# Patient Record
Sex: Male | Born: 1975 | Race: White | Hispanic: No | Marital: Married | Smoking: Never smoker
Health system: Southern US, Community
[De-identification: ages and names within clinical notes are randomized; demographics above are authoritative.]

## PROBLEM LIST (undated history)

## (undated) DIAGNOSIS — Z87442 Personal history of urinary calculi: Secondary | ICD-10-CM

## (undated) DIAGNOSIS — L4050 Arthropathic psoriasis, unspecified: Secondary | ICD-10-CM

## (undated) DIAGNOSIS — T505X5A Adverse effect of appetite depressants, initial encounter: Secondary | ICD-10-CM

## (undated) DIAGNOSIS — G4730 Sleep apnea, unspecified: Secondary | ICD-10-CM

## (undated) DIAGNOSIS — M459 Ankylosing spondylitis of unspecified sites in spine: Secondary | ICD-10-CM

## (undated) DIAGNOSIS — K760 Fatty (change of) liver, not elsewhere classified: Secondary | ICD-10-CM

## (undated) DIAGNOSIS — I10 Essential (primary) hypertension: Secondary | ICD-10-CM

## (undated) HISTORY — PX: VASECTOMY: SHX75

## (undated) HISTORY — PX: COLONOSCOPY W/ POLYPECTOMY: SHX1380

---
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
## 1993-06-04 HISTORY — PX: KNEE ARTHROSCOPY: SHX127

## 1997-11-11 ENCOUNTER — Emergency Department (HOSPITAL_COMMUNITY): Admission: EM | Admit: 1997-11-11 | Discharge: 1997-11-11 | Payer: Self-pay | Admitting: Emergency Medicine

## 2000-10-21 ENCOUNTER — Ambulatory Visit (HOSPITAL_COMMUNITY): Admission: RE | Admit: 2000-10-21 | Discharge: 2000-10-21 | Payer: Self-pay | Admitting: Family Medicine

## 2000-10-21 ENCOUNTER — Encounter: Payer: Self-pay | Admitting: Family Medicine

## 2002-03-12 ENCOUNTER — Emergency Department (HOSPITAL_COMMUNITY): Admission: EM | Admit: 2002-03-12 | Discharge: 2002-03-13 | Payer: Self-pay | Admitting: *Deleted

## 2002-03-13 ENCOUNTER — Encounter: Payer: Self-pay | Admitting: Emergency Medicine

## 2002-03-14 ENCOUNTER — Encounter: Payer: Self-pay | Admitting: *Deleted

## 2002-03-14 ENCOUNTER — Ambulatory Visit (HOSPITAL_COMMUNITY): Admission: RE | Admit: 2002-03-14 | Discharge: 2002-03-14 | Payer: Self-pay | Admitting: *Deleted

## 2006-10-26 ENCOUNTER — Encounter: Admission: RE | Admit: 2006-10-26 | Discharge: 2006-10-26 | Payer: Self-pay | Admitting: Rheumatology

## 2009-07-29 ENCOUNTER — Encounter: Admission: RE | Admit: 2009-07-29 | Discharge: 2009-07-29 | Payer: Self-pay | Admitting: Family Medicine

## 2009-10-24 ENCOUNTER — Ambulatory Visit (HOSPITAL_COMMUNITY): Admission: RE | Admit: 2009-10-24 | Discharge: 2009-10-24 | Payer: Self-pay | Admitting: Urology

## 2009-10-28 ENCOUNTER — Ambulatory Visit (HOSPITAL_BASED_OUTPATIENT_CLINIC_OR_DEPARTMENT_OTHER): Admission: RE | Admit: 2009-10-28 | Discharge: 2009-10-28 | Payer: Self-pay | Admitting: Urology

## 2010-06-04 HISTORY — PX: OTHER SURGICAL HISTORY: SHX169

## 2010-06-25 ENCOUNTER — Encounter: Payer: Self-pay | Admitting: Family Medicine

## 2012-12-19 ENCOUNTER — Other Ambulatory Visit: Payer: Self-pay | Admitting: Family Medicine

## 2012-12-19 DIAGNOSIS — N5089 Other specified disorders of the male genital organs: Secondary | ICD-10-CM

## 2012-12-26 ENCOUNTER — Ambulatory Visit
Admission: RE | Admit: 2012-12-26 | Discharge: 2012-12-26 | Disposition: A | Discharge status: Home / Self Care | Payer: 59 | Source: Ambulatory Visit | Attending: Family Medicine | Admitting: Family Medicine

## 2012-12-26 DIAGNOSIS — N5089 Other specified disorders of the male genital organs: Secondary | ICD-10-CM

## 2013-07-20 ENCOUNTER — Other Ambulatory Visit: Payer: Self-pay | Admitting: Rheumatology

## 2013-07-20 ENCOUNTER — Ambulatory Visit
Admission: RE | Admit: 2013-07-20 | Discharge: 2013-07-20 | Disposition: A | Discharge status: Home / Self Care | Payer: 59 | Source: Ambulatory Visit | Attending: Rheumatology | Admitting: Rheumatology

## 2013-07-20 DIAGNOSIS — M542 Cervicalgia: Secondary | ICD-10-CM

## 2013-07-20 DIAGNOSIS — M549 Dorsalgia, unspecified: Secondary | ICD-10-CM

## 2016-07-26 DIAGNOSIS — M459 Ankylosing spondylitis of unspecified sites in spine: Secondary | ICD-10-CM | POA: Diagnosis not present

## 2016-07-26 DIAGNOSIS — M79641 Pain in right hand: Secondary | ICD-10-CM | POA: Diagnosis not present

## 2016-07-26 DIAGNOSIS — M545 Low back pain: Secondary | ICD-10-CM | POA: Diagnosis not present

## 2016-07-26 DIAGNOSIS — M79642 Pain in left hand: Secondary | ICD-10-CM | POA: Diagnosis not present

## 2016-08-01 DIAGNOSIS — Z79899 Other long term (current) drug therapy: Secondary | ICD-10-CM | POA: Diagnosis not present

## 2016-08-01 DIAGNOSIS — M542 Cervicalgia: Secondary | ICD-10-CM | POA: Diagnosis not present

## 2016-12-03 DIAGNOSIS — L720 Epidermal cyst: Secondary | ICD-10-CM | POA: Diagnosis not present

## 2016-12-03 DIAGNOSIS — L400 Psoriasis vulgaris: Secondary | ICD-10-CM | POA: Diagnosis not present

## 2016-12-03 DIAGNOSIS — D485 Neoplasm of uncertain behavior of skin: Secondary | ICD-10-CM | POA: Diagnosis not present

## 2016-12-03 DIAGNOSIS — B078 Other viral warts: Secondary | ICD-10-CM | POA: Diagnosis not present

## 2016-12-03 DIAGNOSIS — D225 Melanocytic nevi of trunk: Secondary | ICD-10-CM | POA: Diagnosis not present

## 2016-12-17 DIAGNOSIS — D225 Melanocytic nevi of trunk: Secondary | ICD-10-CM | POA: Diagnosis not present

## 2016-12-17 DIAGNOSIS — D485 Neoplasm of uncertain behavior of skin: Secondary | ICD-10-CM | POA: Diagnosis not present

## 2016-12-17 DIAGNOSIS — L988 Other specified disorders of the skin and subcutaneous tissue: Secondary | ICD-10-CM | POA: Diagnosis not present

## 2017-01-10 DIAGNOSIS — D485 Neoplasm of uncertain behavior of skin: Secondary | ICD-10-CM | POA: Diagnosis not present

## 2017-01-10 DIAGNOSIS — L988 Other specified disorders of the skin and subcutaneous tissue: Secondary | ICD-10-CM | POA: Diagnosis not present

## 2017-01-10 DIAGNOSIS — D225 Melanocytic nevi of trunk: Secondary | ICD-10-CM | POA: Diagnosis not present

## 2017-03-05 DIAGNOSIS — I10 Essential (primary) hypertension: Secondary | ICD-10-CM | POA: Diagnosis not present

## 2017-03-05 DIAGNOSIS — Z0000 Encounter for general adult medical examination without abnormal findings: Secondary | ICD-10-CM | POA: Diagnosis not present

## 2017-03-05 DIAGNOSIS — L4059 Other psoriatic arthropathy: Secondary | ICD-10-CM | POA: Diagnosis not present

## 2017-03-05 DIAGNOSIS — L409 Psoriasis, unspecified: Secondary | ICD-10-CM | POA: Diagnosis not present

## 2017-03-21 DIAGNOSIS — Z23 Encounter for immunization: Secondary | ICD-10-CM | POA: Diagnosis not present

## 2017-04-08 DIAGNOSIS — H5213 Myopia, bilateral: Secondary | ICD-10-CM | POA: Diagnosis not present

## 2017-04-10 DIAGNOSIS — R52 Pain, unspecified: Secondary | ICD-10-CM | POA: Diagnosis not present

## 2017-04-10 DIAGNOSIS — M7918 Myalgia, other site: Secondary | ICD-10-CM | POA: Diagnosis not present

## 2017-04-10 DIAGNOSIS — L4059 Other psoriatic arthropathy: Secondary | ICD-10-CM | POA: Diagnosis not present

## 2017-04-19 DIAGNOSIS — Z713 Dietary counseling and surveillance: Secondary | ICD-10-CM | POA: Diagnosis not present

## 2017-04-23 DIAGNOSIS — L4050 Arthropathic psoriasis, unspecified: Secondary | ICD-10-CM | POA: Diagnosis not present

## 2017-04-23 DIAGNOSIS — L409 Psoriasis, unspecified: Secondary | ICD-10-CM | POA: Diagnosis not present

## 2017-05-21 ENCOUNTER — Encounter (HOSPITAL_COMMUNITY): Payer: Self-pay

## 2017-05-21 ENCOUNTER — Emergency Department (HOSPITAL_COMMUNITY): Payer: Commercial Managed Care - PPO

## 2017-05-21 ENCOUNTER — Emergency Department (HOSPITAL_COMMUNITY)
Admission: EM | Admit: 2017-05-21 | Discharge: 2017-05-21 | Disposition: A | Discharge status: Home / Self Care | Payer: Commercial Managed Care - PPO | Attending: Emergency Medicine | Admitting: Emergency Medicine

## 2017-05-21 ENCOUNTER — Other Ambulatory Visit: Payer: Self-pay

## 2017-05-21 DIAGNOSIS — R112 Nausea with vomiting, unspecified: Secondary | ICD-10-CM | POA: Insufficient documentation

## 2017-05-21 DIAGNOSIS — I10 Essential (primary) hypertension: Secondary | ICD-10-CM | POA: Insufficient documentation

## 2017-05-21 DIAGNOSIS — N201 Calculus of ureter: Secondary | ICD-10-CM | POA: Diagnosis not present

## 2017-05-21 DIAGNOSIS — N23 Unspecified renal colic: Secondary | ICD-10-CM

## 2017-05-21 DIAGNOSIS — N132 Hydronephrosis with renal and ureteral calculous obstruction: Secondary | ICD-10-CM | POA: Diagnosis not present

## 2017-05-21 DIAGNOSIS — Z79899 Other long term (current) drug therapy: Secondary | ICD-10-CM | POA: Insufficient documentation

## 2017-05-21 DIAGNOSIS — R0789 Other chest pain: Secondary | ICD-10-CM | POA: Diagnosis not present

## 2017-05-21 DIAGNOSIS — R1012 Left upper quadrant pain: Secondary | ICD-10-CM | POA: Diagnosis present

## 2017-05-21 DIAGNOSIS — R109 Unspecified abdominal pain: Secondary | ICD-10-CM | POA: Diagnosis not present

## 2017-05-21 HISTORY — DX: Arthropathic psoriasis, unspecified: L40.50

## 2017-05-21 HISTORY — DX: Essential (primary) hypertension: I10

## 2017-05-21 LAB — URINALYSIS, ROUTINE W REFLEX MICROSCOPIC
Bilirubin Urine: NEGATIVE
Glucose, UA: NEGATIVE mg/dL
Ketones, ur: NEGATIVE mg/dL
Leukocytes, UA: NEGATIVE
NITRITE: NEGATIVE
PROTEIN: 30 mg/dL — AB
SQUAMOUS EPITHELIAL / LPF: NONE SEEN
Specific Gravity, Urine: 1.021 (ref 1.005–1.030)
pH: 5 (ref 5.0–8.0)

## 2017-05-21 LAB — CBC
HEMATOCRIT: 47.9 % (ref 39.0–52.0)
Hemoglobin: 16.1 g/dL (ref 13.0–17.0)
MCH: 29.1 pg (ref 26.0–34.0)
MCHC: 33.6 g/dL (ref 30.0–36.0)
MCV: 86.5 fL (ref 78.0–100.0)
Platelets: 335 10*3/uL (ref 150–400)
RBC: 5.54 MIL/uL (ref 4.22–5.81)
RDW: 13.4 % (ref 11.5–15.5)
WBC: 14.4 10*3/uL — ABNORMAL HIGH (ref 4.0–10.5)

## 2017-05-21 LAB — COMPREHENSIVE METABOLIC PANEL
ALT: 29 U/L (ref 17–63)
AST: 23 U/L (ref 15–41)
Albumin: 4.3 g/dL (ref 3.5–5.0)
Alkaline Phosphatase: 107 U/L (ref 38–126)
Anion gap: 8 (ref 5–15)
BILIRUBIN TOTAL: 0.6 mg/dL (ref 0.3–1.2)
BUN: 21 mg/dL — ABNORMAL HIGH (ref 6–20)
CO2: 24 mmol/L (ref 22–32)
Calcium: 9.6 mg/dL (ref 8.9–10.3)
Chloride: 106 mmol/L (ref 101–111)
Creatinine, Ser: 1 mg/dL (ref 0.61–1.24)
GFR calc Af Amer: >60 mL/min (ref >60)
GFR calc non Af Amer: >60 mL/min (ref >60)
Glucose, Bld: 165 mg/dL — ABNORMAL HIGH (ref 65–99)
POTASSIUM: 4.3 mmol/L (ref 3.5–5.1)
Sodium: 138 mmol/L (ref 135–145)
TOTAL PROTEIN: 8.1 g/dL (ref 6.5–8.1)

## 2017-05-21 LAB — LIPASE, BLOOD: Lipase: 34 U/L (ref 11–51)

## 2017-05-21 MED ORDER — TAMSULOSIN HCL 0.4 MG PO CAPS: ORAL_CAPSULE | ORAL | 0 refills | Status: DC

## 2017-05-21 MED ORDER — ONDANSETRON HCL 4 MG/2ML IJ SOLN
4.0000 mg | Freq: Once | INTRAMUSCULAR | Status: AC
  Administered 2017-05-21: 4 mg via INTRAVENOUS
  Filled 2017-05-21: qty 2

## 2017-05-21 MED ORDER — OXYCODONE-ACETAMINOPHEN 5-325 MG PO TABS: 1.0000 | ORAL_TABLET | ORAL | 0 refills | Status: DC | PRN

## 2017-05-21 MED ORDER — KETOROLAC TROMETHAMINE 30 MG/ML IJ SOLN
30.0000 mg | Freq: Once | INTRAMUSCULAR | Status: AC
  Administered 2017-05-21: 30 mg via INTRAVENOUS
  Filled 2017-05-21: qty 1

## 2017-05-21 MED ORDER — HYDROMORPHONE HCL 1 MG/ML IJ SOLN
1.0000 mg | INTRAMUSCULAR | Status: AC | PRN
  Administered 2017-05-21 (×2): 1 mg via INTRAVENOUS
  Filled 2017-05-21 (×2): qty 1

## 2017-05-21 MED ORDER — SODIUM CHLORIDE 0.9 % IV SOLN
INTRAVENOUS | Status: DC
  Administered 2017-05-21: 09:00:00 via INTRAVENOUS

## 2017-05-21 MED ORDER — HYDROMORPHONE HCL 1 MG/ML IJ SOLN
1.0000 mg | Freq: Once | INTRAMUSCULAR | Status: AC
  Administered 2017-05-21: 1 mg via INTRAVENOUS
  Filled 2017-05-21: qty 1

## 2017-05-21 NOTE — ED Notes (Signed)
Urinal at bedside. Patient aware we need urine

## 2017-05-21 NOTE — Discharge Instructions (Signed)
Strain your urine, until you catch the stone.  Do not drive, or work when taking the narcotic pain reliever.  Return here, if needed, for problems.

## 2017-05-21 NOTE — ED Provider Notes (Signed)
[facility] DEPT Provider Note   CSN: [id number] Arrival date & time: 05/21/17  8588     History   Chief Complaint Chief Complaint  Patient presents with  . Abdominal Pain  . Back Pain    HPI Trevor Vargas is a 41 y.o. male.  He presents for evaluation of left flank and left upper abdominal pain onset yesterday, worsening.  The pain is associated with nausea and vomiting several times.  The pain feels like a "kidney stone."  He states that his last episode of kidney stone problems was about 6 years ago.  He denies fever, chills, cough, weakness or dizziness.  He came here by private vehicle.  There are no other known modifying factors.  HPI  Past Medical History:  Diagnosis Date  . Hypertension   . Psoriatic arthritis ([place])     There are no active problems to display for this patient.   Past Surgical History:  Procedure Laterality Date  . EYE SURGERY    . kidney stone retraction         Home Medications    Prior to Admission medications   Medication Sig Start Date End Date Taking? Authorizing Provider  Apremilast (OTEZLA) 30 MG TABS Take 30 mg by mouth 2 (two) times daily.   Yes [provider]  lisinopril (PRINIVIL,ZESTRIL) 20 MG tablet Take 20 mg by mouth daily. 05/15/17  Yes [provider]  phentermine 37.5 MG capsule Take 37.5 mg by mouth daily. 05/11/17  Yes [provider]  oxyCODONE-acetaminophen (PERCOCET) 5-325 MG tablet Take 1 tablet by mouth every 4 (four) hours as needed. 05/21/17   Daleen Bo, MD  tamsulosin [facility]) 0.4 MG CAPS capsule 1 q HS to aid stone passage 05/21/17   Daleen Bo, MD    Family History Family History  Problem Relation Age of Onset  . Hypertension Mother   . Hypertension Father     Social History Social History   Tobacco Use  . Smoking status: Never Smoker  . Smokeless tobacco: Never Used  Substance Use Topics  . Alcohol use: No    Frequency: Never  .  Drug use: No     Allergies   Fish allergy   Review of Systems Review of Systems  All other systems reviewed and are negative.    Physical Exam Updated Vital Signs BP 126/84   Pulse 63   Temp (!) 96.7 F (35.9 C) (Axillary) Comment: uanble to collect oral PT drinking water  Resp 18   Ht 5' 9"  (1.753 m)   Wt 127 kg (280 lb)   SpO2 96%   BMI 41.35 kg/m   Physical Exam  Constitutional: He is oriented to person, place, and time. He appears well-developed and well-nourished.  Non-toxic appearance.  HENT:  Head: Normocephalic and atraumatic.  Right Ear: External ear normal.  Left Ear: External ear normal.  Eyes: Conjunctivae and EOM are normal. Pupils are equal, round, and reactive to light.  Neck: Normal range of motion and phonation normal. Neck supple.  Cardiovascular: Normal rate, regular rhythm and normal heart sounds.  Pulmonary/Chest: Effort normal and breath sounds normal. He exhibits no bony tenderness.  Abdominal: Soft. There is tenderness (Mild) in the left upper quadrant. There is no rigidity and no guarding.  Genitourinary:  Genitourinary Comments: Left flank is tender to light palpation.  Musculoskeletal: Normal range of motion.  Neurological: He is alert and oriented to person, place, and time. No cranial nerve deficit or sensory  deficit. He exhibits normal muscle tone. Coordination normal.  Skin: Skin is warm, dry and intact.  Psychiatric: He has a normal mood and affect. His behavior is normal. Judgment and thought content normal.  Nursing note and vitals reviewed.    ED Treatments / Results  Labs (all labs ordered are listed, but only abnormal results are displayed) Labs Reviewed  COMPREHENSIVE METABOLIC PANEL - Abnormal; Notable for the following components:      Result Value   Glucose, Bld 165 (*)    BUN 21 (*)    All other components within normal limits  CBC - Abnormal; Notable for the following components:   WBC 14.4 (*)    All other  components within normal limits  URINALYSIS, ROUTINE W REFLEX MICROSCOPIC - Abnormal; Notable for the following components:   APPearance CLOUDY (*)    Hgb urine dipstick LARGE (*)    Protein, ur 30 (*)    Bacteria, UA FEW (*)    All other components within normal limits  URINE CULTURE  LIPASE, BLOOD    EKG  EKG Interpretation None       Radiology Ct Renal Stone Study  Result Date: 05/21/2017 CLINICAL DATA:  Left flank pain beginning at 3:30 this morning. EXAM: CT ABDOMEN AND PELVIS WITHOUT CONTRAST TECHNIQUE: Multidetector CT imaging of the abdomen and pelvis was performed following the standard protocol without IV contrast. COMPARISON:  CT abdomen and pelvis 10/25/2009. FINDINGS: Lower chest: Densely calcified right lower lobe granuloma is noted. There is some dependent atelectasis in the lung bases. No pleural or pericardial effusion. Hepatobiliary: The liver is low attenuating consistent with diffuse fatty infiltration. The gallbladder and biliary tree appear normal. Pancreas: Fatty atrophy of the pancreas seen on the prior examination has progressed. No focal lesion or surrounding inflammatory change. Spleen: Normal in size without focal abnormality. Adrenals/Urinary Tract: The patient has mild left hydronephrosis due to a 0.6 cm stone at the left ureteropelvic junction. No other urinary tract stones are seen on the left. 0.2 cm nonobstructing stone lower pole right kidney is noted. The kidneys otherwise appear normal. Urinary bladder is unremarkable. The adrenal glands are normal. Stomach/Bowel: Stomach is within normal limits. Appendix appears normal. No evidence of bowel wall thickening, distention, or inflammatory changes. Vascular/Lymphatic: No significant vascular findings are present. No enlarged abdominal or pelvic lymph nodes. Reproductive: Prostate is unremarkable. Other: Fat containing umbilical hernia is seen. The patient also has a small fat containing bilateral inguinal  hernias. No ascites. Musculoskeletal: No acute or focal abnormality. IMPRESSION: Mild left hydronephrosis due to a 0.6 cm stone at the left ureteropelvic junction. 0.2 cm nonobstructing stone lower pole right kidney. Fatty infiltration of the liver. Fatty atrophy of the pancreas seen on the prior examination has progressed. Fat containing umbilical and bilateral inguinal hernias. Electronically Signed   By: Inge Rise M.D.   On: 05/21/2017 09:43    Procedures Procedures (including critical care time)  Medications Ordered in ED Medications  0.9 %  sodium chloride infusion ( Intravenous New Bag/Given 05/21/17 0837)  ondansetron (ZOFRAN) injection 4 mg (4 mg Intravenous Given 05/21/17 0837)  HYDROmorphone (DILAUDID) injection 1 mg (1 mg Intravenous Given 05/21/17 0927)  HYDROmorphone (DILAUDID) injection 1 mg (1 mg Intravenous Given 05/21/17 1101)  ketorolac (TORADOL) 30 MG/ML injection 30 mg (30 mg Intravenous Given 05/21/17 1247)     Initial Impression / Assessment and Plan / ED Course  I have reviewed the triage vital signs and the nursing notes.  Pertinent labs &  imaging results that were available during my care of the patient were reviewed by me and considered in my medical decision making (see chart for details).  Clinical Course as of May 22 1315  Tue May 21, 2017  1314 Elevated RBC / HPF: TOO NUMEROUS TO COUNT [EW]  1314 Elevated Bacteria, UA: (!) FEW [EW]  1314 Abnormal, clean catch sample Bacteria, UA: (!) FEW [EW]  1314 Mild elevation WBC: (!) 14.4 [EW]  1314 Elevated Glucose: (!) 165 [EW]    Clinical Course User Index [EW] Daleen Bo, MD     Patient Vitals for the past 24 hrs:  BP Temp Temp src Pulse Resp SpO2 Height Weight  05/21/17 1230 126/84 - - 63 - - - -  05/21/17 1200 118/84 - - 77 18 96 % - -  05/21/17 1130 (!) 124/95 - - 73 - 94 % - -  05/21/17 1100 127/86 - - (!) 58 - 94 % - -  05/21/17 1030 113/82 - - 66 - 97 % - -  05/21/17 1024 125/77 - - 77  18 97 % - -  05/21/17 0741 - - - - - - 5' 9"  (1.753 m) 127 kg (280 lb)  05/21/17 0738 (!) 140/104 (!) 96.7 F (35.9 C) Axillary 71 19 98 % - -    1:13 PM Reevaluation with update and discussion. After initial assessment and treatment, an updated evaluation reveals patient is fairly comfortable now and desires to go home.  Findings discussed and questions answered. Daleen Bo      Final Clinical Impressions(s) / ED Diagnoses   Final diagnoses:  Renal colic on left side  Left ureteral stone   Renal colic with ureteral stone, stone is fairly large but very distal and has a good chance of passing spontaneously.  Doubt UTI, or sepsis.  Urine culture sent.  Plan follow-up with urology 1 week, return here if needed  Nursing Notes Reviewed/ Care Coordinated Applicable Imaging Reviewed Interpretation of Laboratory Data incorporated into ED treatment  The patient appears reasonably screened and/or stabilized for discharge and I doubt any other medical condition or other [facility] requiring further screening, evaluation, or treatment in the ED at this time prior to discharge.  Plan: Home Medications-continue usual medications.; Home Treatments-strain urine; return here if the recommended treatment, does not improve the symptoms; Recommended follow up-PCP, as needed; urology, 1 week.    ED Discharge Orders        Ordered    oxyCODONE-acetaminophen (PERCOCET) 5-325 MG tablet  Every 4 hours PRN     05/21/17 1313    tamsulosin (FLOMAX) 0.4 MG CAPS capsule     05/21/17 1313       Daleen Bo, MD 05/21/17 1316

## 2017-05-21 NOTE — ED Notes (Signed)
Urinal at bedside, aware we need urine

## 2017-05-21 NOTE — ED Triage Notes (Signed)
Patient c/o left upper abdominal pain that radiates into the left mid back area that started at 0400 today. Patient also c/o vomiting x 4-5 today.

## 2017-05-22 LAB — URINE CULTURE: Culture: NO GROWTH

## 2017-05-23 DIAGNOSIS — N201 Calculus of ureter: Secondary | ICD-10-CM | POA: Diagnosis not present

## 2017-05-30 DIAGNOSIS — G4733 Obstructive sleep apnea (adult) (pediatric): Secondary | ICD-10-CM | POA: Diagnosis not present

## 2017-06-03 DIAGNOSIS — Z713 Dietary counseling and surveillance: Secondary | ICD-10-CM | POA: Diagnosis not present

## 2017-08-08 DIAGNOSIS — L4050 Arthropathic psoriasis, unspecified: Secondary | ICD-10-CM | POA: Diagnosis not present

## 2017-08-08 DIAGNOSIS — M459 Ankylosing spondylitis of unspecified sites in spine: Secondary | ICD-10-CM | POA: Diagnosis not present

## 2018-01-23 DIAGNOSIS — M459 Ankylosing spondylitis of unspecified sites in spine: Secondary | ICD-10-CM | POA: Diagnosis not present

## 2018-01-23 DIAGNOSIS — L4050 Arthropathic psoriasis, unspecified: Secondary | ICD-10-CM | POA: Diagnosis not present

## 2018-03-05 DIAGNOSIS — G4733 Obstructive sleep apnea (adult) (pediatric): Secondary | ICD-10-CM | POA: Diagnosis not present

## 2018-03-11 DIAGNOSIS — J069 Acute upper respiratory infection, unspecified: Secondary | ICD-10-CM | POA: Diagnosis not present

## 2018-03-11 DIAGNOSIS — Z6841 Body Mass Index (BMI) 40.0 and over, adult: Secondary | ICD-10-CM | POA: Diagnosis not present

## 2018-03-21 DIAGNOSIS — I10 Essential (primary) hypertension: Secondary | ICD-10-CM | POA: Diagnosis not present

## 2018-03-21 DIAGNOSIS — L4059 Other psoriatic arthropathy: Secondary | ICD-10-CM | POA: Diagnosis not present

## 2018-03-21 DIAGNOSIS — Z0000 Encounter for general adult medical examination without abnormal findings: Secondary | ICD-10-CM | POA: Diagnosis not present

## 2018-04-25 DIAGNOSIS — R7303 Prediabetes: Secondary | ICD-10-CM | POA: Diagnosis not present

## 2018-04-25 DIAGNOSIS — I10 Essential (primary) hypertension: Secondary | ICD-10-CM | POA: Diagnosis not present

## 2018-04-25 DIAGNOSIS — Z6841 Body Mass Index (BMI) 40.0 and over, adult: Secondary | ICD-10-CM | POA: Diagnosis not present

## 2018-04-25 DIAGNOSIS — E782 Mixed hyperlipidemia: Secondary | ICD-10-CM | POA: Diagnosis not present

## 2018-05-06 DIAGNOSIS — M459 Ankylosing spondylitis of unspecified sites in spine: Secondary | ICD-10-CM | POA: Diagnosis not present

## 2018-05-06 DIAGNOSIS — L409 Psoriasis, unspecified: Secondary | ICD-10-CM | POA: Diagnosis not present

## 2018-05-06 DIAGNOSIS — L4050 Arthropathic psoriasis, unspecified: Secondary | ICD-10-CM | POA: Diagnosis not present

## 2018-06-03 DIAGNOSIS — G4733 Obstructive sleep apnea (adult) (pediatric): Secondary | ICD-10-CM | POA: Diagnosis not present

## 2018-07-04 DIAGNOSIS — Z6841 Body Mass Index (BMI) 40.0 and over, adult: Secondary | ICD-10-CM | POA: Diagnosis not present

## 2018-07-04 DIAGNOSIS — Z713 Dietary counseling and surveillance: Secondary | ICD-10-CM | POA: Diagnosis not present

## 2018-07-30 DIAGNOSIS — G4733 Obstructive sleep apnea (adult) (pediatric): Secondary | ICD-10-CM | POA: Diagnosis not present

## 2019-01-16 IMAGING — CT CT RENAL STONE PROTOCOL
2 of 4 series · 16 of 46 positions shown, 18 images · non-contrast
Comparison: CT abdomen and pelvis 10/25/2009.

CLINICAL DATA: Left flank pain beginning at [DATE] this morning.

EXAM:
CT ABDOMEN AND PELVIS WITHOUT CONTRAST
TECHNIQUE: Multidetector CT imaging of the abdomen and pelvis was performed
following the standard protocol without IV contrast.

[Series 2: axial st · axial · 0.83mm/px · z∈[-463,-58]mm · 13 of 93 slices shown, 15 images]
[im 6/93  soft-tissue]
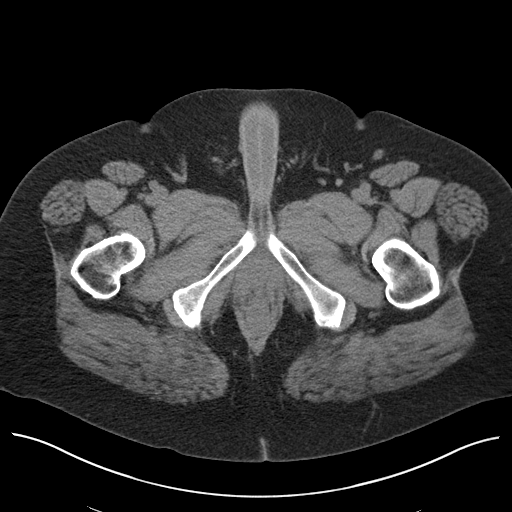
[im 6/93  bone]
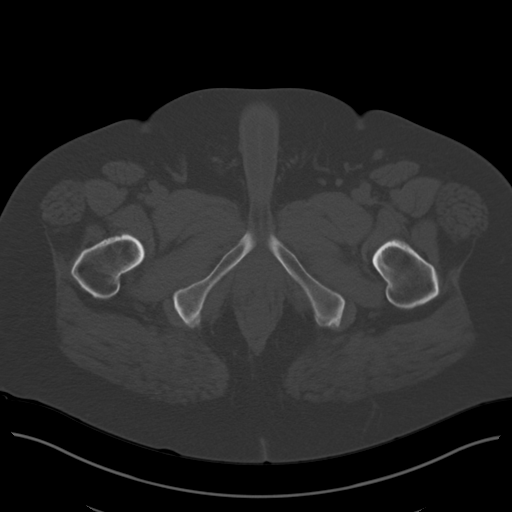
[im 11/93  soft-tissue]
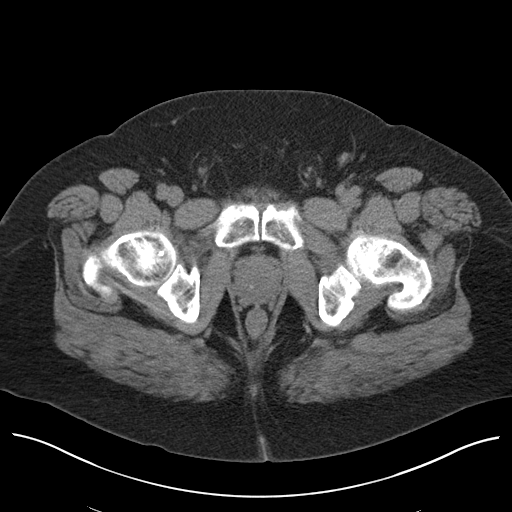
[im 21/93  soft-tissue]
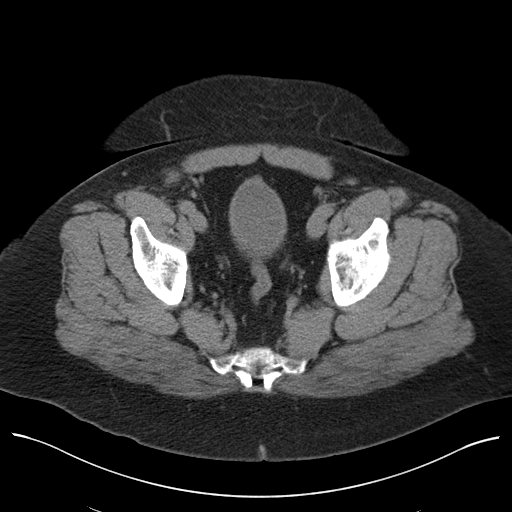
[im 26/93  soft-tissue]
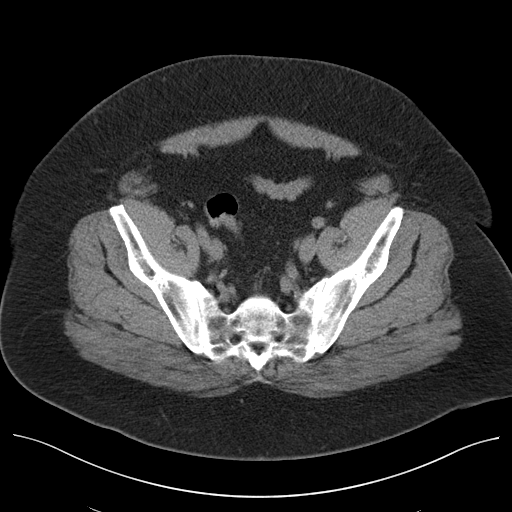
[im 31/93  soft-tissue]
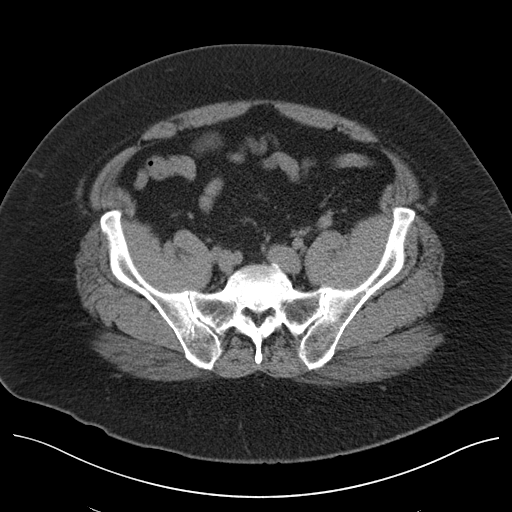
[im 41/93  soft-tissue]
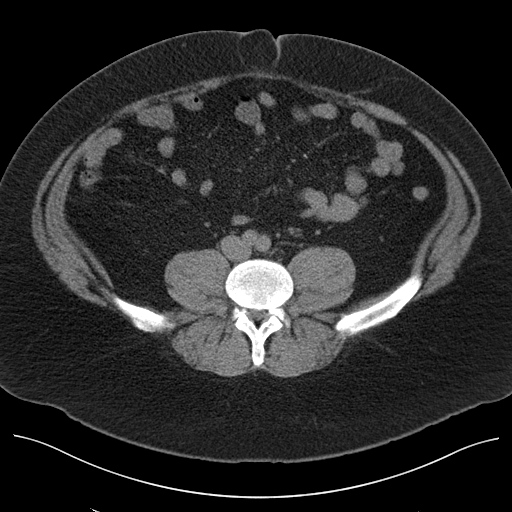
[im 47/93  soft-tissue]
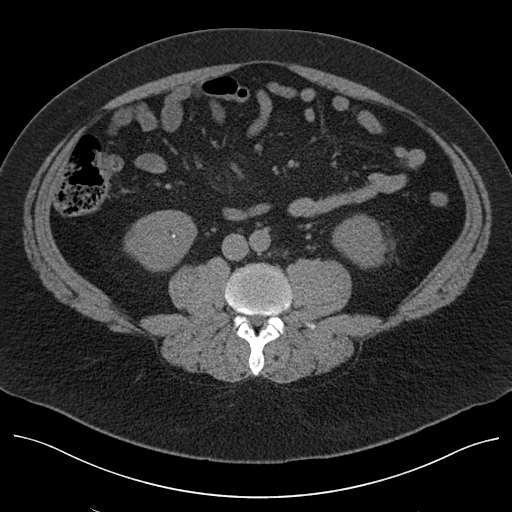
[im 52/93  soft-tissue]
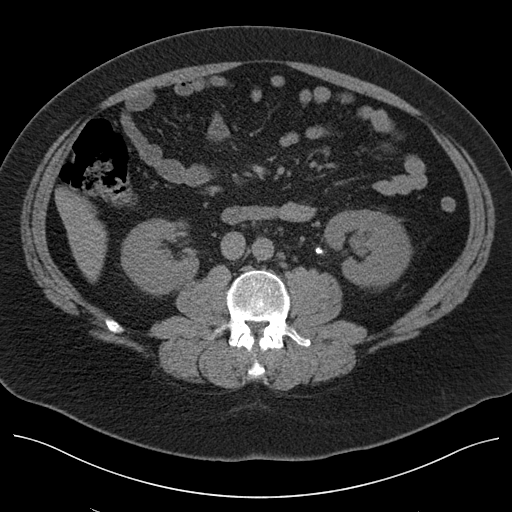
[im 62/93  soft-tissue]
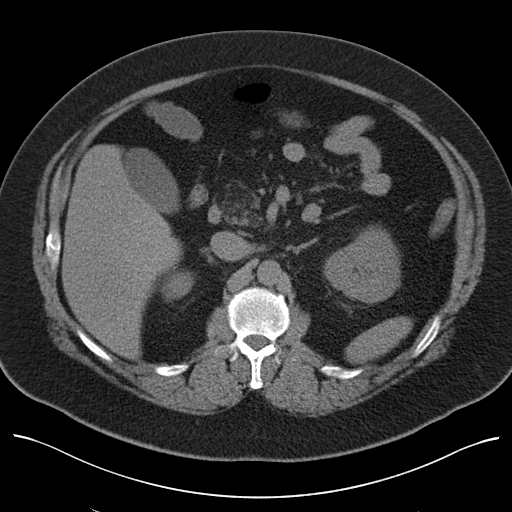
[im 62/93  bone]
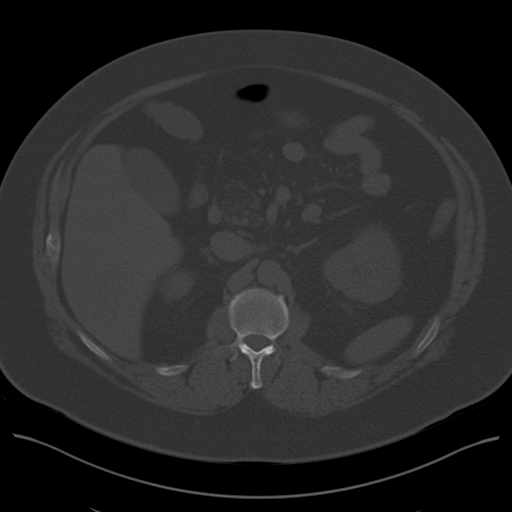
[im 67/93  soft-tissue]
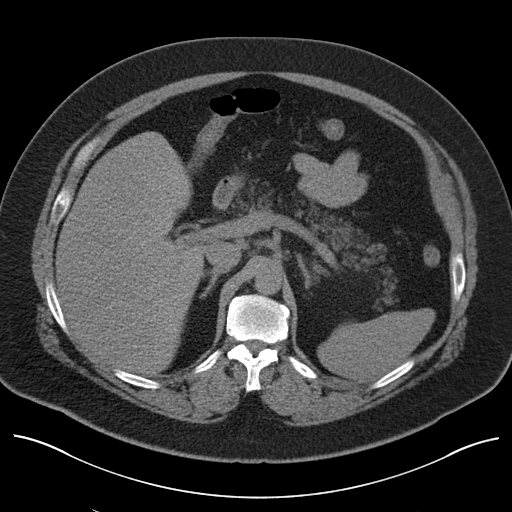
[im 72/93  soft-tissue]
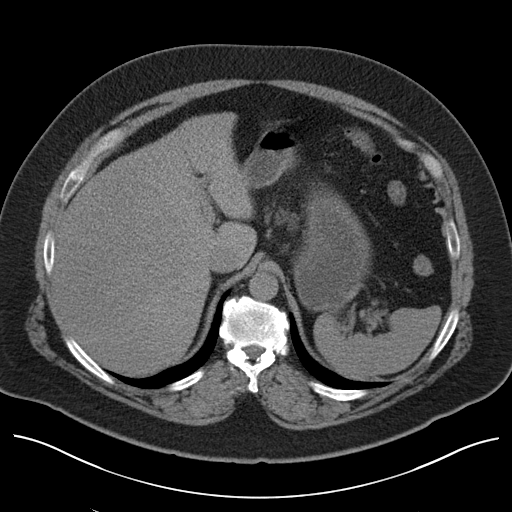
[im 82/93  soft-tissue]
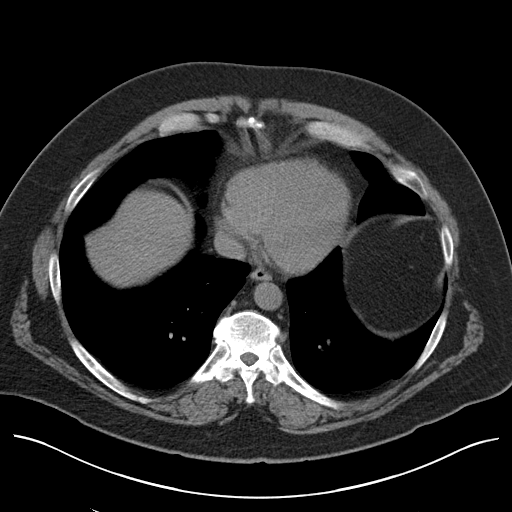
[im 87/93  soft-tissue]
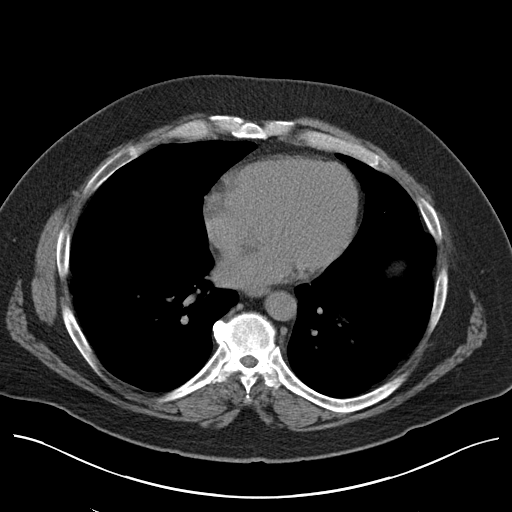

[Series 4: coronal · coronal · 0.92mm/px · 3 of 157 slices shown]
[im 53/157  soft-tissue]
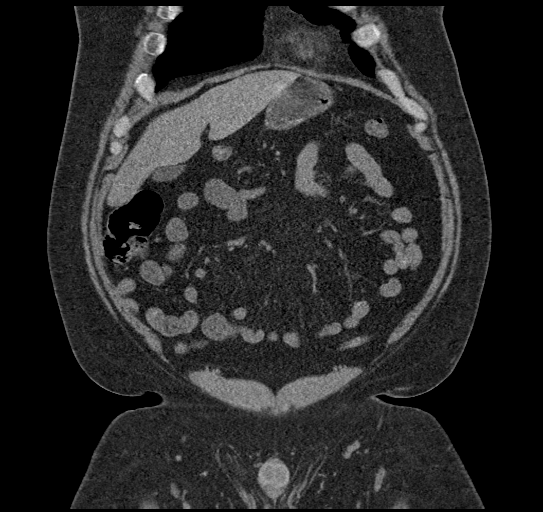
[im 70/157  soft-tissue]
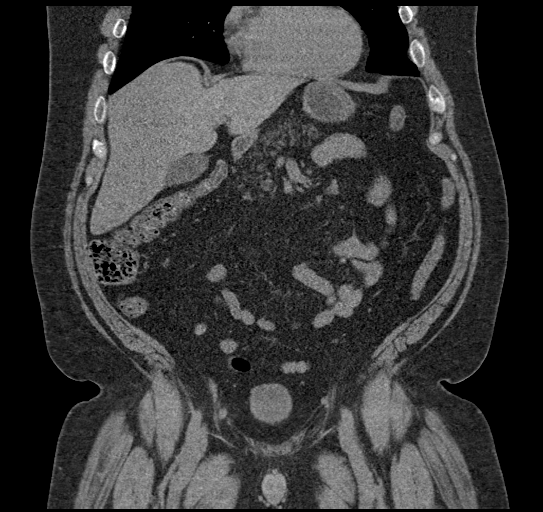
[im 87/157  soft-tissue]
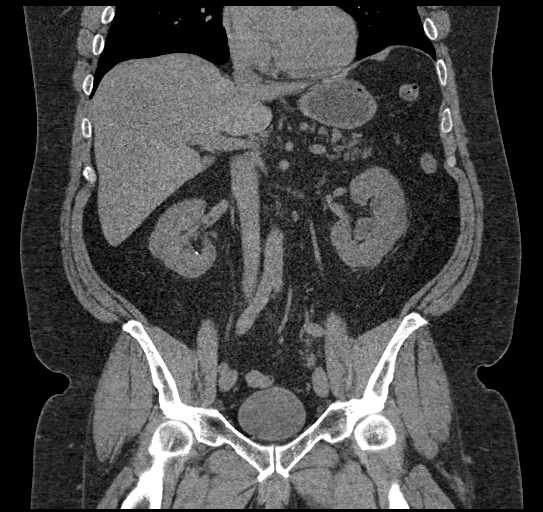

[16 of 46 positions shown; findings below may reference images not displayed]

FINDINGS: Lower chest: Densely calcified right lower lobe granuloma is noted.
There is some dependent atelectasis in the lung bases. No pleural or
pericardial effusion.

Hepatobiliary: The liver is low attenuating consistent with diffuse
fatty infiltration. The gallbladder and biliary tree appear normal.

Pancreas: Fatty atrophy of the pancreas seen on the prior
examination has progressed. No focal lesion or surrounding
inflammatory change.

Spleen: Normal in size without focal abnormality.

Adrenals/Urinary Tract: The patient has mild left hydronephrosis due
to a 0.6 cm stone at the left ureteropelvic junction. No other
urinary tract stones are seen on the left. 0.2 cm nonobstructing
stone lower pole right kidney is noted. The kidneys otherwise appear
normal. Urinary bladder is unremarkable. The adrenal glands are
normal.

Stomach/Bowel: Stomach is within normal limits. Appendix appears
normal. No evidence of bowel wall thickening, distention, or
inflammatory changes.

Vascular/Lymphatic: No significant vascular findings are present. No
enlarged abdominal or pelvic lymph nodes.

Reproductive: Prostate is unremarkable.

Other: Fat containing umbilical hernia is seen. The patient also has
a small fat containing bilateral inguinal hernias. No ascites.

Musculoskeletal: No acute or focal abnormality.
IMPRESSION: Mild left hydronephrosis due to a 0.6 cm stone at the left
ureteropelvic junction.

0.2 cm nonobstructing stone lower pole right kidney.

Fatty infiltration of the liver.

Fatty atrophy of the pancreas seen on the prior examination has
progressed.

Fat containing umbilical and bilateral inguinal hernias.

## 2023-10-15 ENCOUNTER — Ambulatory Visit: Payer: Self-pay | Admitting: Surgery

## 2023-10-15 NOTE — Progress Notes (Signed)
 Surgery orders requested via Epic inbox.

## 2023-10-15 NOTE — Progress Notes (Signed)
 COVID Vaccine received:  [x]  No []  Yes Date of any COVID positive Test in last 90 days:  none  PCP - Perley Bradley, MD at [facility]  [phone]   [phone]  Cardiologist - none Rheumatology- Stefan Edge, MD [phone]  Chest x-ray -  EKG -  will do at PST Stress Test -  ECHO -  Cardiac Cath -   Bowel Prep - [x]  No  []   Yes ______  Pacemaker / ICD device [x]  No []  Yes   Spinal Cord Stimulator:[x]  No []  Yes       History of Sleep Apnea? []  No [x]  Yes   CPAP used?- []  No [x]  Yes    Does the patient monitor blood sugar?   [x]  N/A   []  No []  Yes  Patient has: [x]  NO Hx DM   []  Pre-DM   []  DM1  []   DM2  Blood Thinner / Instructions:  none Aspirin Instructions:  none  ERAS Protocol Ordered: []  No  [x]  Yes PRE-SURGERY []  ENSURE  []  G2   [x]  No Drink Ordered Patient is to be NPO after: 0800  Dental hx: []  Dentures:  [x]  N/A      []  Bridge or Partial:                   []  Loose or Damaged teeth:   Activity level: Patient is able to climb a flight of stairs without difficulty; [x]  No CP  [x]  No SOB. Patient can perform ADLs without assistance.   Anesthesia review: HTN, psoriatic arthritis, OSA-CPAP  , fatty liver, ankylosing spondylitis.   Patient denies shortness of breath, fever, cough and chest pain at PAT appointment.  Patient verbalized understanding and agreement to the Pre-Surgical Instructions that were given to them at this PAT appointment. Patient was also educated of the need to review these PAT instructions again prior to his surgery.I reviewed the appropriate phone numbers to call if they have any and questions or concerns.

## 2023-10-15 NOTE — Patient Instructions (Signed)
 SURGICAL WAITING ROOM VISITATION Patients having surgery or a procedure may have no more than 2 support people in the waiting area - these visitors may rotate in the visitor waiting room.   If the patient needs to stay at the hospital during part of their recovery, the visitor guidelines for inpatient rooms apply.  PRE-OP VISITATION  Pre-op nurse will coordinate an appropriate time for 1 support person to accompany the patient in pre-op.  This support person may not rotate.  This visitor will be contacted when the time is appropriate for the visitor to come back in the pre-op area.  Please refer to the [facility] website for the visitor guidelines for Inpatients (after your surgery is over and you are in a regular room).  You are not required to quarantine at this time prior to your surgery. However, you must do this: Hand Hygiene often Do NOT share personal items Notify your provider if you are in close contact with someone who has COVID or you develop fever 100.4 or greater, new onset of sneezing, cough, sore throat, shortness of breath or body aches.  If you test positive for Covid or have been in contact with anyone that has tested positive in the last 10 days please notify you surgeon.    Your procedure is scheduled on:  MONDAY  Oct 21, 2023  Report to [facility] Main Entrance: [address] entrance where the [employer] is available.   Report to admitting at:  08:45   AM  Call this number if you have any questions or problems the morning of surgery [phone]  FOLLOW ANY ADDITIONAL PRE OP INSTRUCTIONS YOU RECEIVED FROM YOUR SURGEON'S OFFICE!!!  Do not eat food after Midnight the night prior to your surgery/procedure.  After Midnight you may have the following liquids until  08:00  AM DAY OF SURGERY  Clear Liquid Diet Water Black Coffee (sugar ok, NO MILK/CREAM OR CREAMERS)  Tea (sugar ok, NO MILK/CREAM OR CREAMERS) regular and decaf                             Plain  Jell-O  with no fruit (NO RED)                                           Fruit ices (not with fruit pulp, NO RED)                                     Popsicles (NO RED)                                                                  Juice: NO CITRUS JUICES: only apple, WHITE grape, WHITE cranberry Sports drinks like Gatorade or Powerade (NO RED)               Oral Hygiene is also important to reduce your risk of infection.        Remember - BRUSH YOUR TEETH THE MORNING OF SURGERY WITH YOUR REGULAR TOOTHPASTE  Do NOT smoke  after Midnight the night before surgery.  STOP TAKING all Vitamins, Herbs and supplements 1 week before your surgery.   Take ONLY these medicines the morning of surgery with A SIP OF WATER:  none ?   If You have been diagnosed with Sleep Apnea - Bring CPAP mask and tubing day of surgery. We will provide you with a CPAP machine on the day of your surgery.                   You may not have any metal on your body including jewelry, and body piercing  Do not wear  lotions, powders, cologne, or deodorant  Men may shave face and neck.  Contacts, Hearing Aids, dentures or bridgework may not be worn into surgery. DENTURES WILL BE REMOVED PRIOR TO SURGERY PLEASE DO NOT APPLY "Poly grip" OR ADHESIVES!!!  Patients discharged on the day of surgery will not be allowed to drive home.  Someone NEEDS to stay with you for the first 24 hours after anesthesia.  Do not bring your home medications to the hospital. The Pharmacy will dispense medications listed on your medication list to you during your admission in the Hospital.  Special Instructions: Bring a copy of your healthcare power of attorney and living will documents the day of surgery, if you wish to have them scanned into your [facility] Records- EPIC  Please read over the following fact sheets you were given: IF YOU HAVE QUESTIONS ABOUT YOUR PRE-OP INSTRUCTIONS, PLEASE CALL [phone]   [facility] - Preparing  for Surgery Before surgery, you can play an important role.  Because skin is not sterile, your skin needs to be as free of germs as possible.  You can reduce the number of germs on your skin by washing with CHG (chlorahexidine gluconate) soap before surgery.  CHG is an antiseptic cleaner which kills germs and bonds with the skin to continue killing germs even after washing. Please DO NOT use if you have an allergy to CHG or antibacterial soaps.  If your skin becomes reddened/irritated stop using the CHG and inform your nurse when you arrive at Short Stay. Do not shave (including legs and underarms) for at least 48 hours prior to the first CHG shower.  You may shave your face/neck.  Please follow these instructions carefully:  1.  Shower with CHG Soap the night before surgery and the  morning of surgery.  2.  If you choose to wash your hair, wash your hair first as usual with your normal  shampoo.  3.  After you shampoo, rinse your hair and body thoroughly to remove the shampoo.                             4.  Use CHG as you would any other liquid soap.  You can apply chg directly to the skin and wash.  Gently with a scrungie or clean washcloth.  5.  Apply the CHG Soap to your body ONLY FROM THE NECK DOWN.   Do not use on face/ open                           Wound or open sores. Avoid contact with eyes, ears mouth and genitals (private parts).                       Wash face,  Medical illustrator (private  parts) with your normal soap.             6.  Wash thoroughly, paying special attention to the area where your  surgery  will be performed.  7.  Thoroughly rinse your body with warm water from the neck down.  8.  DO NOT shower/wash with your normal soap after using and rinsing off the CHG Soap.            9.  Pat yourself dry with a clean towel.            10.  Wear clean pajamas.            11.  Place clean sheets on your bed the night of your first shower and do not  sleep with pets.  ON THE DAY OF  SURGERY : Do not apply any lotions/deodorants the morning of surgery.  Please wear clean clothes to the hospital/surgery center.     FAILURE TO FOLLOW THESE INSTRUCTIONS MAY RESULT IN THE CANCELLATION OF YOUR SURGERY  PATIENT SIGNATURE_________________________________  NURSE SIGNATURE__________________________________  ________________________________________________________________________

## 2023-10-17 ENCOUNTER — Other Ambulatory Visit: Payer: Self-pay

## 2023-10-17 ENCOUNTER — Encounter (HOSPITAL_COMMUNITY): Payer: Self-pay

## 2023-10-17 ENCOUNTER — Encounter (HOSPITAL_COMMUNITY)
Admission: RE | Admit: 2023-10-17 | Discharge: 2023-10-17 | Disposition: A | Discharge status: Home / Self Care | Source: Ambulatory Visit | Attending: Surgery | Admitting: Surgery

## 2023-10-17 VITALS — BP 110/75 | HR 68 | Temp 98.7°F | Resp 22 | Ht 69.0 in | Wt 298.0 lb

## 2023-10-17 DIAGNOSIS — K760 Fatty (change of) liver, not elsewhere classified: Secondary | ICD-10-CM | POA: Diagnosis not present

## 2023-10-17 DIAGNOSIS — Z01818 Encounter for other preprocedural examination: Secondary | ICD-10-CM | POA: Insufficient documentation

## 2023-10-17 DIAGNOSIS — Z79899 Other long term (current) drug therapy: Secondary | ICD-10-CM | POA: Diagnosis not present

## 2023-10-17 DIAGNOSIS — I10 Essential (primary) hypertension: Secondary | ICD-10-CM | POA: Diagnosis not present

## 2023-10-17 DIAGNOSIS — T505X5S Adverse effect of appetite depressants, sequela: Secondary | ICD-10-CM

## 2023-10-17 HISTORY — DX: Personal history of urinary calculi: Z87.442

## 2023-10-17 HISTORY — DX: Ankylosing spondylitis of unspecified sites in spine: M45.9

## 2023-10-17 HISTORY — DX: Sleep apnea, unspecified: G47.30

## 2023-10-17 HISTORY — DX: Adverse effect of appetite depressants, initial encounter: T50.5X5A

## 2023-10-17 HISTORY — DX: Fatty (change of) liver, not elsewhere classified: K76.0

## 2023-10-17 LAB — COMPREHENSIVE METABOLIC PANEL WITH GFR
ALT: 23 U/L (ref 0–44)
AST: 17 U/L (ref 15–41)
Albumin: 3.7 g/dL (ref 3.5–5.0)
Alkaline Phosphatase: 73 U/L (ref 38–126)
Anion gap: 6 (ref 5–15)
BUN: 18 mg/dL (ref 6–20)
CO2: 26 mmol/L (ref 22–32)
Calcium: 8.9 mg/dL (ref 8.9–10.3)
Chloride: 105 mmol/L (ref 98–111)
Creatinine, Ser: 0.88 mg/dL (ref 0.61–1.24)
GFR, Estimated: >60 mL/min (ref >60)
Glucose, Bld: 114 mg/dL — ABNORMAL HIGH (ref 70–99)
Potassium: 4.2 mmol/L (ref 3.5–5.1)
Sodium: 137 mmol/L (ref 135–145)
Total Bilirubin: 1 mg/dL (ref 0.0–1.2)
Total Protein: 7.3 g/dL (ref 6.5–8.1)

## 2023-10-17 LAB — CBC
HCT: 49 % (ref 39.0–52.0)
Hemoglobin: 16 g/dL (ref 13.0–17.0)
MCH: 29.1 pg (ref 26.0–34.0)
MCHC: 32.7 g/dL (ref 30.0–36.0)
MCV: 89.3 fL (ref 80.0–100.0)
Platelets: 268 10*3/uL (ref 150–400)
RBC: 5.49 MIL/uL (ref 4.22–5.81)
RDW: 13.3 % (ref 11.5–15.5)
WBC: 7.1 10*3/uL (ref 4.0–10.5)
nRBC: 0 % (ref 0.0–0.2)

## 2023-10-21 ENCOUNTER — Ambulatory Visit (HOSPITAL_BASED_OUTPATIENT_CLINIC_OR_DEPARTMENT_OTHER): Admitting: Anesthesiology

## 2023-10-21 ENCOUNTER — Ambulatory Visit (HOSPITAL_COMMUNITY)
Admission: RE | Admit: 2023-10-21 | Discharge: 2023-10-21 | Disposition: A | Discharge status: Home / Self Care | Source: Ambulatory Visit | Attending: Surgery | Admitting: Surgery

## 2023-10-21 ENCOUNTER — Encounter (HOSPITAL_COMMUNITY)
Admission: RE | Disposition: A | Discharge status: Home / Self Care | Payer: Self-pay | Source: Ambulatory Visit | Attending: Surgery

## 2023-10-21 ENCOUNTER — Other Ambulatory Visit: Payer: Self-pay

## 2023-10-21 ENCOUNTER — Ambulatory Visit (HOSPITAL_COMMUNITY): Admitting: Anesthesiology

## 2023-10-21 ENCOUNTER — Encounter (HOSPITAL_COMMUNITY): Payer: Self-pay | Admitting: Surgery

## 2023-10-21 DIAGNOSIS — G4730 Sleep apnea, unspecified: Secondary | ICD-10-CM | POA: Diagnosis not present

## 2023-10-21 DIAGNOSIS — K429 Umbilical hernia without obstruction or gangrene: Secondary | ICD-10-CM | POA: Insufficient documentation

## 2023-10-21 DIAGNOSIS — M459 Ankylosing spondylitis of unspecified sites in spine: Secondary | ICD-10-CM | POA: Diagnosis not present

## 2023-10-21 DIAGNOSIS — I10 Essential (primary) hypertension: Secondary | ICD-10-CM | POA: Insufficient documentation

## 2023-10-21 DIAGNOSIS — E66813 Obesity, class 3: Secondary | ICD-10-CM | POA: Diagnosis not present

## 2023-10-21 DIAGNOSIS — K420 Umbilical hernia with obstruction, without gangrene: Secondary | ICD-10-CM | POA: Diagnosis present

## 2023-10-21 DIAGNOSIS — Z6841 Body Mass Index (BMI) 40.0 and over, adult: Secondary | ICD-10-CM

## 2023-10-21 DIAGNOSIS — Z79899 Other long term (current) drug therapy: Secondary | ICD-10-CM | POA: Diagnosis not present

## 2023-10-21 HISTORY — PX: UMBILICAL HERNIA REPAIR: SHX196

## 2023-10-21 SURGERY — REPAIR, HERNIA, UMBILICAL, ADULT
Anesthesia: General

## 2023-10-21 MED ORDER — FENTANYL CITRATE PF 50 MCG/ML IJ SOSY
25.0000 ug | PREFILLED_SYRINGE | INTRAMUSCULAR | Status: DC | PRN
  Administered 2023-10-21 (×2): 50 ug via INTRAVENOUS

## 2023-10-21 MED ORDER — KETOROLAC TROMETHAMINE 15 MG/ML IJ SOLN
15.0000 mg | INTRAMUSCULAR | Status: DC
Start: 2023-10-21 — End: 2023-10-21

## 2023-10-21 MED ORDER — PROPOFOL 10 MG/ML IV BOLUS
INTRAVENOUS | Status: AC
  Filled 2023-10-21: qty 20

## 2023-10-21 MED ORDER — ORAL CARE MOUTH RINSE: 15.0000 mL | Freq: Once | OROMUCOSAL | Status: AC

## 2023-10-21 MED ORDER — CEFAZOLIN SODIUM-DEXTROSE 3-4 GM/150ML-% IV SOLN
3.0000 g | INTRAVENOUS | Status: AC
  Administered 2023-10-21: 3 g via INTRAVENOUS
  Filled 2023-10-21: qty 150

## 2023-10-21 MED ORDER — MIDAZOLAM HCL 5 MG/5ML IJ SOLN
INTRAMUSCULAR | Status: DC | PRN
  Administered 2023-10-21: 2 mg via INTRAVENOUS

## 2023-10-21 MED ORDER — HEPARIN SODIUM (PORCINE) 5000 UNIT/ML IJ SOLN
5000.0000 [IU] | Freq: Once | INTRAMUSCULAR | Status: AC
  Administered 2023-10-21: 5000 [IU] via SUBCUTANEOUS
  Filled 2023-10-21: qty 1

## 2023-10-21 MED ORDER — LIDOCAINE HCL (CARDIAC) PF 100 MG/5ML IV SOSY
PREFILLED_SYRINGE | INTRAVENOUS | Status: DC | PRN
  Administered 2023-10-21: 100 mg via INTRAVENOUS

## 2023-10-21 MED ORDER — ROCURONIUM BROMIDE 100 MG/10ML IV SOLN
INTRAVENOUS | Status: DC | PRN
  Administered 2023-10-21: 30 mg via INTRAVENOUS

## 2023-10-21 MED ORDER — MIDAZOLAM HCL 2 MG/2ML IJ SOLN
INTRAMUSCULAR | Status: AC
  Filled 2023-10-21: qty 2

## 2023-10-21 MED ORDER — BUPIVACAINE LIPOSOME 1.3 % IJ SUSP: 20.0000 mL | Freq: Once | INTRAMUSCULAR | Status: DC

## 2023-10-21 MED ORDER — ROCURONIUM BROMIDE 10 MG/ML (PF) SYRINGE
PREFILLED_SYRINGE | INTRAVENOUS | Status: AC
  Filled 2023-10-21: qty 10

## 2023-10-21 MED ORDER — GABAPENTIN 300 MG PO CAPS
300.0000 mg | ORAL_CAPSULE | ORAL | Status: AC
  Administered 2023-10-21: 300 mg via ORAL
  Filled 2023-10-21: qty 1

## 2023-10-21 MED ORDER — DEXAMETHASONE SODIUM PHOSPHATE 10 MG/ML IJ SOLN
INTRAMUSCULAR | Status: DC | PRN
  Administered 2023-10-21: 10 mg via INTRAVENOUS

## 2023-10-21 MED ORDER — FENTANYL CITRATE PF 50 MCG/ML IJ SOSY
PREFILLED_SYRINGE | INTRAMUSCULAR | Status: AC
  Filled 2023-10-21: qty 2

## 2023-10-21 MED ORDER — PROPOFOL 10 MG/ML IV BOLUS
INTRAVENOUS | Status: DC | PRN
  Administered 2023-10-21: 200 mg via INTRAVENOUS

## 2023-10-21 MED ORDER — LACTATED RINGERS IV SOLN: INTRAVENOUS | Status: DC

## 2023-10-21 MED ORDER — ACETAMINOPHEN 500 MG PO TABS
1000.0000 mg | ORAL_TABLET | Freq: Once | ORAL | Status: AC
  Administered 2023-10-21: 1000 mg via ORAL
  Filled 2023-10-21: qty 2

## 2023-10-21 MED ORDER — AMISULPRIDE (ANTIEMETIC) 5 MG/2ML IV SOLN
10.0000 mg | Freq: Once | INTRAVENOUS | Status: AC | PRN
  Administered 2023-10-21: 10 mg via INTRAVENOUS

## 2023-10-21 MED ORDER — OXYCODONE-ACETAMINOPHEN 5-325 MG PO TABS: 1.0000 | ORAL_TABLET | ORAL | 0 refills | Status: AC | PRN

## 2023-10-21 MED ORDER — CHLORHEXIDINE GLUCONATE CLOTH 2 % EX PADS: 6.0000 | MEDICATED_PAD | Freq: Once | CUTANEOUS | Status: DC

## 2023-10-21 MED ORDER — ACETAMINOPHEN 500 MG PO TABS: 1000.0000 mg | ORAL_TABLET | ORAL | Status: DC

## 2023-10-21 MED ORDER — SUCCINYLCHOLINE CHLORIDE 200 MG/10ML IV SOSY
PREFILLED_SYRINGE | INTRAVENOUS | Status: DC | PRN
  Administered 2023-10-21: 200 mg via INTRAVENOUS

## 2023-10-21 MED ORDER — OXYCODONE HCL 5 MG/5ML PO SOLN: 5.0000 mg | Freq: Once | ORAL | Status: AC | PRN

## 2023-10-21 MED ORDER — LIDOCAINE HCL (PF) 2 % IJ SOLN
INTRAMUSCULAR | Status: AC
  Filled 2023-10-21: qty 5

## 2023-10-21 MED ORDER — FENTANYL CITRATE (PF) 100 MCG/2ML IJ SOLN
INTRAMUSCULAR | Status: DC | PRN
  Administered 2023-10-21: 100 ug via INTRAVENOUS

## 2023-10-21 MED ORDER — BUPIVACAINE-EPINEPHRINE 0.25% -1:200000 IJ SOLN
INTRAMUSCULAR | Status: DC | PRN
  Administered 2023-10-21: 10 mL

## 2023-10-21 MED ORDER — ONDANSETRON HCL 4 MG/2ML IJ SOLN
INTRAMUSCULAR | Status: DC | PRN
  Administered 2023-10-21: 4 mg via INTRAVENOUS

## 2023-10-21 MED ORDER — OXYCODONE HCL 5 MG PO TABS
5.0000 mg | ORAL_TABLET | Freq: Once | ORAL | Status: AC | PRN
  Administered 2023-10-21: 5 mg via ORAL

## 2023-10-21 MED ORDER — ONDANSETRON HCL 4 MG/2ML IJ SOLN
INTRAMUSCULAR | Status: AC
  Filled 2023-10-21: qty 2

## 2023-10-21 MED ORDER — DEXAMETHASONE SODIUM PHOSPHATE 10 MG/ML IJ SOLN
INTRAMUSCULAR | Status: AC
  Filled 2023-10-21: qty 1

## 2023-10-21 MED ORDER — OXYCODONE HCL 5 MG PO TABS
ORAL_TABLET | ORAL | Status: AC
  Filled 2023-10-21: qty 1

## 2023-10-21 MED ORDER — CHLORHEXIDINE GLUCONATE 0.12 % MT SOLN
15.0000 mL | Freq: Once | OROMUCOSAL | Status: AC
  Administered 2023-10-21: 15 mL via OROMUCOSAL

## 2023-10-21 MED ORDER — SUGAMMADEX SODIUM 200 MG/2ML IV SOLN
INTRAVENOUS | Status: DC | PRN
  Administered 2023-10-21: 300 mg via INTRAVENOUS

## 2023-10-21 MED ORDER — FENTANYL CITRATE (PF) 100 MCG/2ML IJ SOLN
INTRAMUSCULAR | Status: AC
  Filled 2023-10-21: qty 2

## 2023-10-21 MED ORDER — AMISULPRIDE (ANTIEMETIC) 5 MG/2ML IV SOLN
INTRAVENOUS | Status: AC
  Filled 2023-10-21: qty 4

## 2023-10-21 MED ORDER — BUPIVACAINE-EPINEPHRINE (PF) 0.25% -1:200000 IJ SOLN
INTRAMUSCULAR | Status: AC
  Filled 2023-10-21: qty 30

## 2023-10-21 MED ORDER — 0.9 % SODIUM CHLORIDE (POUR BTL) OPTIME
TOPICAL | Status: DC | PRN
  Administered 2023-10-21: 1000 mL

## 2023-10-21 MED ORDER — KETOROLAC TROMETHAMINE 15 MG/ML IJ SOLN
INTRAMUSCULAR | Status: AC
  Filled 2023-10-21: qty 1

## 2023-10-21 SURGICAL SUPPLY — 24 items
COTTONBALL LRG STERILE PKG (GAUZE/BANDAGES/DRESSINGS) ×2 IMPLANT
COVER SURGICAL LIGHT HANDLE (MISCELLANEOUS) ×2 IMPLANT
DERMABOND ADVANCED .7 DNX12 (GAUZE/BANDAGES/DRESSINGS) ×2 IMPLANT
DRAPE LAPAROTOMY T 98X78 PEDS (DRAPES) ×2 IMPLANT
DRSG TEGADERM 4X4.75 (GAUZE/BANDAGES/DRESSINGS) ×2 IMPLANT
ELECT REM PT RETURN 15FT ADLT (MISCELLANEOUS) ×2 IMPLANT
GLOVE INDICATOR 8.0 STRL GRN (GLOVE) ×2 IMPLANT
GOWN STRL REUS W/ TWL XL LVL3 (GOWN DISPOSABLE) ×2 IMPLANT
KIT BASIN OR (CUSTOM PROCEDURE TRAY) ×2 IMPLANT
KIT TURNOVER KIT A (KITS) IMPLANT
MESH BARD SOFT 3X6IN (Mesh General) IMPLANT
NDL HYPO 20X1 EYE RM 11 (NEEDLE) ×2 IMPLANT
NEEDLE HYPO 20X1 EYE RM 11 (NEEDLE) ×1 IMPLANT
NS IRRIG 1000ML POUR BTL (IV SOLUTION) ×2 IMPLANT
PACK GENERAL/GYN (CUSTOM PROCEDURE TRAY) ×2 IMPLANT
SPIKE FLUID TRANSFER (MISCELLANEOUS) ×2 IMPLANT
SUT MNCRL AB 4-0 PS2 18 (SUTURE) ×2 IMPLANT
SUT NOVA NAB GS-21 0 18 T12 DT (SUTURE) IMPLANT
SUT PDS AB 1 CT1 27 (SUTURE) IMPLANT
SUT PDS AB 2-0 CT2 27 (SUTURE) IMPLANT
SUT VIC AB 3-0 SH 8-18 (SUTURE) ×2 IMPLANT
SYR CONTROL 10ML LL (SYRINGE) ×2 IMPLANT
TOWEL OR 17X26 10 PK STRL BLUE (TOWEL DISPOSABLE) ×2 IMPLANT
WATER STERILE IRR 1000ML POUR (IV SOLUTION) IMPLANT

## 2023-10-21 NOTE — Anesthesia Procedure Notes (Signed)
 Procedure Name: Intubation Date/Time: 10/21/2023 10:57 AM  Performed by: Mervyn Ace, CRNAPre-anesthesia Checklist: Patient identified, Emergency Drugs available, Suction available, Timeout performed and Patient being monitored Patient Re-evaluated:Patient Re-evaluated prior to induction Oxygen Delivery Method: Circle system utilized Preoxygenation: Pre-oxygenation with 100% oxygen Induction Type: IV induction Ventilation: Mask ventilation without difficulty Laryngoscope Size: Glidescope and 4 (GlideScope S4 blade) Grade View: Grade I Tube type: Oral Tube size: 8.0 mm Number of attempts: 1 Airway Equipment and Method: Stylet and Video-laryngoscopy Placement Confirmation: ETT inserted through vocal cords under direct vision, positive ETCO2, CO2 detector and breath sounds checked- equal and bilateral Secured at: 23 cm Tube secured with: Tape (secured with pink Hy-tape) Dental Injury: Teeth and Oropharynx as per pre-operative assessment

## 2023-10-21 NOTE — Discharge Instructions (Signed)
 VENTRAL HERNIA REPAIR POST OPERATIVE INSTRUCTIONS  Thinking Clearly  The anesthesia may cause you to feel different for 1 or 2 days. Do not drive, drink alcohol, or make any big decisions for at least 2 days.  Nutrition When you wake up, you will be able to drink small amounts of liquid. If you do not feel sick, you can slowly advance your diet to regular foods. Continue to drink lots of fluids, usually about 8 to 10 glasses per day. Eat a high-fiber diet so you don't strain during bowel movements. High-Fiber Foods Foods high in fiber include beans, bran cereals and whole-grain breads, peas, dried fruit (figs, apricots, and dates), raspberries, blackberries, strawberries, sweet corn, broccoli, baked potatoes with skin, plums, pears, apples, greens, and nuts. Activity Slowly increase your activity. Be sure to get up and walk every hour or so to prevent blood clots. No heavy lifting or strenuous activity for 4 weeks following surgery to prevent hernias at your incision sites or recurrence of your hernia. It is normal to feel tired. You may need more sleep than usual.  Get your rest but make sure to get up and move around frequently to prevent blood clots and pneumonia.  Work and Return to [employer] can go back to work when you feel well enough. Discuss the timing with your surgeon. You can usually go back to school or work 1 week or less after an laparoscopic or an open repair. If your work requires heavy lifting or strenuous activity you need to be placed on light duty for 4 weeks following surgery. You can return to gym class, sports or other physical activities 4 weeks after surgery.  Wound Care You may experience significant bruising throughout the abdominal wall that may track down into the groin including into the scrotum in males.  Rest, elevating the groin and scrotum above the level of the heart, ice and compression with tight fitting underwear or an abdominal binder can help.   Always wash your hands before and after touching near your incision site. Do not soak in a bathtub until cleared at your follow up appointment. You may take a shower 24 hours after surgery. A small amount of drainage from the incision is normal. If the drainage is thick and yellow or the site is red, you may have an infection, so call your surgeon. If you have a drain in one of your incisions, it will be taken out in office when the drainage stops. Steri-Strips will fall off in 7 to 10 days or they will be removed during your first office visit. If you have dermabond glue covering over the incision, allow the glue to flake off on its own. Protect the new skin, especially from the sun. The sun can burn and cause darker scarring. Your scar will heal in about 4 to 6 weeks and will become softer and continue to fade over the next year.  The cosmetic appearance of the incisions will improve over the course of the first year after surgery. Sensation around your incision will return in a few weeks or months.  Bowel Movements After intestinal surgery, you may have loose watery stools for several days. If watery diarrhea lasts longer than 3 days, contact your surgeon. Pain medication (narcotics) can cause constipation. Increase the fiber in your diet with high-fiber foods if you are constipated. You can take an over the counter stool softener like Colace to avoid constipation.  Additional over the counter medications can also be used  if Colace isn't sufficient (for example, Milk of Magnesia or Miralax).  Pain The amount of pain is different for each person. Some people need only 1 to 3 doses of pain control medication, while others need more. Take alternating doses of tylenol and ibuprofen around the clock for the first five days following surgery.  This will provide a baseline of pain control and help with inflammation.  Take the narcotic pain medication in addition if needed for severe pain.  Contact  Your Surgeon at [phone], if you have: Pain that will not go away Pain that gets worse A fever of more than 101F (38.3C) Repeated vomiting Swelling, redness, bleeding, or bad-smelling drainage from your wound site Strong abdominal pain No bowel movement or unable to pass gas for 3 days Watery diarrhea lasting longer than 3 days  Pain Control The goal of pain control is to minimize pain, keep you moving and help you heal. Your surgical team will work with you on your pain plan. Most often a combination of therapies and medications are used to control your pain. You may also be given medication (local anesthetic) at the surgical site. This may help control your pain for several days. Extreme pain puts extra stress on your body at a time when your body needs to focus on healing. Do not wait until your pain has reached a level "10" or is unbearable before telling your doctor or nurse. It is much easier to control pain before it becomes severe. Following a laparoscopic procedure, pain is sometimes felt in the shoulder. This is due to the gas inserted into your abdomen during the procedure. Moving and walking helps to decrease the gas and the right shoulder pain.  Use the guide below for ways to manage your post-operative pain. Learn more by going to facs.org/safepaincontrol.  How Intense Is My Pain Common Therapies to Feel Better       I hardly notice my pain, and it does not interfere with my activities.  I notice my pain and it distracts me, but I can still do activities (sitting up, walking, standing).  Non-Medication Therapies  Ice (in a bag, applied over clothing at the surgical site), elevation, rest, meditation, massage, distraction (music, TV, play) walking and mild exercise Splinting the abdomen with pillows +  Non-Opioid Medications Acetaminophen (Tylenol) Non-steroidal anti-inflammatory drugs (NSAIDS) Aspirin, Ibuprofen (Motrin, Advil) Naproxen (Aleve) Take these as  needed, when you feel pain. Both acetaminophen and NSAIDs help to decrease pain and swelling (inflammation).      My pain is hard to ignore and is more noticeable even when I rest.  My pain interferes with my usual activities.  Non-Medication Therapies  +  Non-Opioid medications  Take on a regular schedule (around-the-clock) instead of as needed. (For example, Tylenol every 6 hours at 9:00 am, 3:00 pm, 9:00 pm, 3:00 am and Motrin every 6 hours at 12:00 am, 6:00 am, 12:00 pm, 6:00 pm)         I am focused on my pain, and I am not doing my daily activities.  I am groaning in pain, and I cannot sleep. I am unable to do anything.  My pain is as bad as it could be, and nothing else matters.  Non-Medication Therapies  +  Around-the-Clock Non-Opioid Medications  +  Short-acting opioids  Opioids should be used with other medications to manage severe pain. Opioids block pain and give a feeling of euphoria (feel high). Addiction, a serious side effect of opioids, is  rare with short-term (a few days) use.  Examples of short-acting opioids include: Tramadol (Ultram), Hydrocodone (Norco, Vicodin), Hydromorphone (Dilaudid), Oxycodone (Oxycontin)     The above directions have been adapted from the [employer] of [employer] Surgical Patient Education Program.  Please refer to the ACS website if needed: http://kaiser.net/   Ivar Drape, MD [facility], [place] [address], [address], [place], [place]  [address] ?  [address] [address], [place], [place]   [address] [phone] ? [phone] ? FAX [phone] Web site: [email or web address]

## 2023-10-21 NOTE — Op Note (Signed)
   Patient: Trevor Vargas (1976/02/23, [id number])  Date of Surgery: 10/21/2023  Preoperative Diagnosis: UMBILICAL HERNIA 1cm x 1cm based on preop exam  Postoperative Diagnosis: UMBILICAL HERNIA 1cm x 1cm based on preop exam  Surgical Procedure: Open umbilical hernia repair with mesh  Operative Team Members:  Surgeons and Role:    * Deiondre Harrower, Avon Boers, MD - Primary   Anesthesiologist: Grace Laura, MD CRNA: Virgil Griffiths, CRNA; Mervyn Ace, CRNA   Anesthesia: General   Fluids:  Total I/O In: 1150 [I.V.:1000; IV Piggyback:150] Out: 5 [Blood:5]  Complications: * No complications entered in OR log *  Drains:  none   Specimen: * No specimens in log *   Disposition:  PACU - hemodynamically stable.  Plan of Care: Discharge to home after PACU    Indications for Procedure: Trevor Vargas is a 48 y.o. male who presented with an umbilical hernia.  I recommended open repair, possibly with mesh. We discussed the procedure its risks, benefits and alternatives and the patient granted consent to proceed.  Findings:  Hernia Location: Umbilical Hernia Size:  1 x 1 cm  Mesh Size &Type:  7.5 x 7.5 cm Bard soft Mesh Position: Preperitoneal  Description of Procedure:  The patient was positioned supine, padded and secured to the bed.  The abdomen was widely prepped and draped.  A time out procedure was performed.   A curvilinear incision was made below the umbilicus and dissection was carried down through the subcutaneous tissue to the level of the fascia.  The umbilical stalk was encircled and the hernia sac amputated off the umbilical skin.  The hernia defect was consistent with 1 cm wide by 1 cm in vertical dimension on physical exam.    An umbilical hernia repair with mesh was performed.  The hernia sac was scored along the perimeter of the fascial defect, entering the pre peritoneal plane.  A wide pre peritoneal dissection was performed creating a space for mesh placement.  A piece  of Bard Soft was opened and trimmed to 1 cm x 1 cm and deployed into the pre peritoneal space.  The mesh laid in taut position in the pre peritoneal plane and did not require fixation.  The space was irrigated with normal saline.  The fascial defect was closed horizontally, overtop the mesh with figure of eight 1 PDS suture.  The umbilical skin was tacked down to the fascial repair with 2-0 vicryl suture.  The skin was closed with 4-0 Monocryl subcuticular suture and skin glue.    Teddie Favre, MD General, Bariatric, & Minimally Invasive Surgery [facility], [place]

## 2023-10-21 NOTE — Anesthesia Preprocedure Evaluation (Addendum)
 Anesthesia Evaluation  Patient identified by MRN, date of birth, ID band Patient awake    Reviewed: Allergy & Precautions, NPO status , Patient's Chart, lab work & pertinent test results  Airway Mallampati: III  TM Distance: >3 FB Neck ROM: Full    Dental no notable dental hx. (+) Teeth Intact, Dental Advisory Given   Pulmonary sleep apnea and Continuous Positive Airway Pressure Ventilation    Pulmonary exam normal breath sounds clear to auscultation       Cardiovascular hypertension, Pt. on medications Normal cardiovascular exam Rhythm:Regular Rate:Normal     Neuro/Psych negative neurological ROS  negative psych ROS   GI/Hepatic negative GI ROS, Neg liver ROS,,,  Endo/Other    Class 3 obesity (BMI 44)  Renal/GU negative Renal ROS  negative genitourinary   Musculoskeletal  (+) Arthritis ,  Ankylosing spondiltis   Abdominal   Peds  Hematology negative hematology ROS (+)   Anesthesia Other Findings   Reproductive/Obstetrics                             Anesthesia Physical Anesthesia Plan  ASA: 3  Anesthesia Plan: General   Post-op Pain Management: Tylenol  PO (pre-op)*   Induction: Intravenous  PONV Risk Score and Plan: 2 and Midazolam , Dexamethasone  and Ondansetron   Airway Management Planned: Oral ETT and Video Laryngoscope Planned  Additional Equipment:   Intra-op Plan:   Post-operative Plan: Extubation in OR  Informed Consent: I have reviewed the patients History and Physical, chart, labs and discussed the procedure including the risks, benefits and alternatives for the proposed anesthesia with the patient or authorized representative who has indicated his/her understanding and acceptance.     Dental advisory given  Plan Discussed with: CRNA  Anesthesia Plan Comments:        Anesthesia Quick Evaluation

## 2023-10-21 NOTE — Anesthesia Postprocedure Evaluation (Signed)
 Anesthesia Post Note  Patient: Herby Lolling  Procedure(s) Performed: REPAIR, HERNIA, UMBILICAL, ADULT     Patient location during evaluation: PACU Anesthesia Type: General Level of consciousness: awake and alert Pain management: pain level controlled Vital Signs Assessment: post-procedure vital signs reviewed and stable Respiratory status: spontaneous breathing, nonlabored ventilation, respiratory function stable and patient connected to nasal cannula oxygen Cardiovascular status: blood pressure returned to baseline and stable Postop Assessment: no apparent nausea or vomiting Anesthetic complications: no  No notable events documented.  Last Vitals:  Vitals:   10/21/23 1245 10/21/23 1300  BP: 119/75 112/70  Pulse: (!) 57 (!) 58  Resp: 13 10  Temp:  (!) 36 C  SpO2: 96% 98%    Last Pain:  Vitals:   10/21/23 1300  TempSrc:   PainSc: Asleep                 Edom Schmuhl L Elizebeth Kluesner

## 2023-10-21 NOTE — H&P (Signed)
 Admitting Physician: Avon Boers Dae Antonucci  Service: General SUrgery  CC: Umbilical hernia  Subjective   HPI: Trevor Vargas is an 48 y.o. male who is here for umbilical hernia repair  Past Medical History:  Diagnosis Date   Ankylosing spondylitis (HCC)    Exposure to phentermine    Fatty liver    History of kidney stones    Hypertension    Psoriatic arthritis (HCC)    Sleep apnea    uses CPAP    Past Surgical History:  Procedure Laterality Date   COLONOSCOPY W/ POLYPECTOMY     EYE SURGERY Right 1987   fx of orbital floor (baseball injury),   kidney stone retraction  2012   KNEE ARTHROSCOPY Right 1995   with synovectomy   VASECTOMY      Family History  Problem Relation Age of Onset   Hypertension Mother    Hypertension Father     Social:  reports that he has never smoked. He has never used smokeless tobacco. He reports that he does not drink alcohol and does not use drugs.  Allergies:  Allergies  Allergen Reactions   Fish-Derived Products Nausea And Vomiting    SHELLFISH IS OKAY.    Fish Allergy Nausea And Vomiting    SHELLFISH IS OKAY   Ixekizumab Other (See Comments)    injection site reactions- Taltz    Medications: Current Outpatient Medications  Medication Instructions   Calcium Carbonate (CALCIUM 600 PO) 600 mg, Oral, Daily   cholecalciferol (VITAMIN D3) 1,000 Units, Oral, Daily   Cosentyx Sensoready (300 MG) 300 mg, Subcutaneous, Every 30 days   hydrochlorothiazide (HYDRODIURIL) 25 mg, Oral, Every morning   lisinopril (ZESTRIL) 20 mg, Oral, Daily    ROS - all of the below systems have been reviewed with the patient and positives are indicated with bold text General: chills, fever or night sweats Eyes: blurry vision or double vision ENT: epistaxis or sore throat Allergy/Immunology: itchy/watery eyes or nasal congestion Hematologic/Lymphatic: bleeding problems, blood clots or swollen lymph nodes Endocrine: temperature intolerance or unexpected  weight changes Breast: new or changing breast lumps or nipple discharge Resp: cough, shortness of breath, or wheezing CV: chest pain or dyspnea on exertion GI: as per HPI GU: dysuria, trouble voiding, or hematuria MSK: joint pain or joint stiffness Neuro: TIA or stroke symptoms Derm: pruritus and skin lesion changes Psych: anxiety and depression  Objective   PE Blood pressure 130/84, pulse 71, temperature 98.2 F (36.8 C), temperature source Oral, resp. rate 16, weight 135.2 kg, SpO2 99%. Constitutional: NAD; conversant; no deformities Eyes: Moist conjunctiva; no lid lag; anicteric; PERRL Neck: Trachea midline; no thyromegaly Lungs: Normal respiratory effort; no tactile fremitus CV: RRR; no palpable thrills; no pitting edema GI: Abd umbilical hernia with approximately 1cm x 1cm fascial defect; no palpable hepatosplenomegaly MSK: Normal range of motion of extremities; no clubbing/cyanosis Psychiatric: Appropriate affect; alert and oriented x3 Lymphatic: No palpable cervical or axillary lymphadenopathy  No results found for this or any previous visit (from the past 24 hours).  Imaging Orders  No imaging studies ordered today     Assessment and Plan   Trevor Vargas is an 48 y.o. male with an umbilical hernia.  I recommended open umbilical hernia repair with mesh.  Umbilical hernia measures about 1 cm by 1 cm on preoperative exam. We discussed the procedure itself as well as its risk, benefits, and alternatives. After full discussion all questions answered the patient granted consent to proceed.  Notes from office visit: Surgery scheduler will reach out to the patient to schedule surgery.  We decided to ignore the inguinal hernias for now as he has absolutely no symptoms in that area.  Umbilical hernia without obstruction or gangrene The umbilical hernia is asymptomatic, but he desires surgical intervention. Surgical repair will involve a small incision at the umbilical site,  repositioning the hernia contents, placing a mesh, and closing the muscles. This is a same-day procedure with anticipated discharge on the same day. Post-operative care includes activity restrictions and pain management. - Schedule surgical repair of the umbilical hernia. - Advise no heavy lifting or strenuous activity for four weeks post-surgery. - Prescribe Percocet for pain management post-surgery, with the option to use Tylenol  or ibuprofen as needed. - Instruct on wound care, including allowing glue on the incision to get wet in the shower but avoiding submersion in water until healed. - Advise on return to work and driving, with no heavy lifting over 15 pounds for four weeks and driving permitted once off narcotic pain medication.  Bilateral inguinal hernia without obstruction or gangrene The bilateral inguinal hernias are asymptomatic with no pain or noticeable lumps. Surgical intervention is not planned unless he becomes symptomatic.   Junie Olds, MD  [facility] Use [email or web address] to contact on call provider

## 2023-10-21 NOTE — Transfer of Care (Signed)
 Immediate Anesthesia Transfer of Care Note  Patient: Trevor Vargas  Procedure(s) Performed: REPAIR, HERNIA, UMBILICAL, ADULT  Patient Location: PACU  Anesthesia Type:General  Level of Consciousness: awake, alert , oriented, and patient cooperative  Airway & Oxygen Therapy: Patient Spontanous Breathing and Patient connected to face mask oxygen  Post-op Assessment: Report given to RN and Post -op Vital signs reviewed and stable  Post vital signs: Reviewed and stable  Last Vitals:  Vitals Value Taken Time  BP 128/87 10/21/23 1155  Temp    Pulse 76 10/21/23 1157  Resp 17 10/21/23 1157  SpO2 98 % 10/21/23 1157  Vitals shown include unfiled device data.  Last Pain:  Vitals:   10/21/23 0904  TempSrc:   PainSc: 0-No pain         Complications: No notable events documented.

## 2023-10-22 ENCOUNTER — Encounter (HOSPITAL_COMMUNITY): Payer: Self-pay | Admitting: Surgery

## 2024-05-15 ENCOUNTER — Emergency Department (HOSPITAL_BASED_OUTPATIENT_CLINIC_OR_DEPARTMENT_OTHER)

## 2024-05-15 ENCOUNTER — Other Ambulatory Visit: Payer: Self-pay

## 2024-05-15 ENCOUNTER — Emergency Department (HOSPITAL_BASED_OUTPATIENT_CLINIC_OR_DEPARTMENT_OTHER): Admitting: Radiology

## 2024-05-15 ENCOUNTER — Emergency Department (HOSPITAL_BASED_OUTPATIENT_CLINIC_OR_DEPARTMENT_OTHER)
Admission: EM | Admit: 2024-05-15 | Discharge: 2024-05-15 | Disposition: A | Discharge status: Home / Self Care | Attending: Emergency Medicine | Admitting: Emergency Medicine

## 2024-05-15 ENCOUNTER — Encounter (HOSPITAL_BASED_OUTPATIENT_CLINIC_OR_DEPARTMENT_OTHER): Payer: Self-pay

## 2024-05-15 DIAGNOSIS — X501XXA Overexertion from prolonged static or awkward postures, initial encounter: Secondary | ICD-10-CM | POA: Insufficient documentation

## 2024-05-15 DIAGNOSIS — S56911A Strain of unspecified muscles, fascia and tendons at forearm level, right arm, initial encounter: Secondary | ICD-10-CM | POA: Insufficient documentation

## 2024-05-15 MED ORDER — OXYCODONE-ACETAMINOPHEN 5-325 MG PO TABS
1.0000 | ORAL_TABLET | Freq: Once | ORAL | Status: AC
  Administered 2024-05-15: 1 via ORAL
  Filled 2024-05-15: qty 1

## 2024-05-15 NOTE — ED Triage Notes (Signed)
 Pt reports trying to close window when he felt and heard something tear in R arm. Pt reports pain from elbow to fingers. PT has good distal PMS to RUE.

## 2024-05-15 NOTE — ED Provider Notes (Signed)
 [place] EMERGENCY DEPARTMENT AT [facility] Provider Note   CSN: [id number] Arrival date & time: 05/15/24  1825     Patient presents with: Arm Injury   Trevor Vargas is a 48 y.o. male.   Patient here with pain in Trevor Vargas right elbow/forearm after trying to close a window.  Trevor Vargas felt a tear in Trevor Vargas right elbow area.  Trevor Vargas denies any weakness but Trevor Vargas is having a lot of pain when moving Trevor Vargas right arm.  No pain elsewhere.  Trevor Vargas said a little bit of tingling.  Denies any chest pain shoulder pain bicep pain.  No prior injuries to this area.  The history is provided by the patient.       Prior to Admission medications  Medication Sig Start Date End Date Taking? Authorizing Provider  Calcium Carbonate (CALCIUM 600 PO) Take 600 mg by mouth daily.    [provider]  cholecalciferol (VITAMIN D3) 25 MCG (1000 UNIT) tablet Take 1,000 Units by mouth daily.    [provider]  COSENTYX SENSOREADY, 300 MG, 150 MG/ML SOAJ Inject 300 mg into the skin every 30 (thirty) days. 10/17/23   [provider]  hydrochlorothiazide (HYDRODIURIL) 25 MG tablet Take 25 mg by mouth every morning.    [provider]  lisinopril (PRINIVIL,ZESTRIL) 20 MG tablet Take 20 mg by mouth daily. 05/15/17   [provider]  oxyCODONE -acetaminophen  (PERCOCET) 5-325 MG tablet Take 1 tablet by mouth every 4 (four) hours as needed for severe pain (pain score 7-10). 10/21/23 10/20/24  Stechschulte, Deward PARAS, MD    Allergies: Fish protein-containing drug products, Fish allergy, and Ixekizumab    Review of Systems  Updated Vital Signs BP 117/73 (BP Location: Left Arm)   Pulse 78   Temp 97.8 F (36.6 C) (Oral)   Resp 16   Ht 5' 9 (1.753 m)   Wt (!) 138.3 kg   SpO2 96%   BMI 45.04 kg/m   Physical Exam Vitals and nursing note reviewed.  Constitutional:      General: Trevor Vargas is not in acute distress.    Appearance: Trevor Vargas is well-developed.  HENT:     Head: Normocephalic and atraumatic.   Eyes:     Conjunctiva/sclera: Conjunctivae normal.  Cardiovascular:     Rate and Rhythm: Normal rate and regular rhythm.     Heart sounds: No murmur heard. Pulmonary:     Effort: Pulmonary effort is normal. No respiratory distress.     Breath sounds: Normal breath sounds.  Abdominal:     Palpations: Abdomen is soft.     Tenderness: There is no abdominal tenderness.  Musculoskeletal:        General: Tenderness present. No swelling.     Cervical back: Neck supple.     Comments: Tenderness to the right elbow area, proximal forearm, Trevor Vargas can flex and extend at the elbow but has discomfort with this Trevor Vargas has some discomfort with extension at the right wrist and feels okay with flexion of the right wrist, Trevor Vargas is not really tender in the bicep area but does maybe have some tenderness in the distal bicep area/right elbow area.  No obvious deformity or swelling, does have pain with supination of the right arm  Skin:    General: Skin is warm and dry.     Capillary Refill: Capillary refill takes less than 2 seconds.  Neurological:     General: No focal deficit present.     Mental Status: Trevor Vargas is alert.  Sensory: No sensory deficit.     Motor: No weakness.  Psychiatric:        Mood and Affect: Mood normal.     (all labs ordered are listed, but only abnormal results are displayed) Labs Reviewed - No data to display  EKG: None  Radiology: DG Elbow Complete Right Result Date: 05/15/2024 CLINICAL DATA:  Pain anteriorly injury EXAM: RIGHT ELBOW - COMPLETE 3+ VIEW COMPARISON:  None Available. FINDINGS: There is no evidence of fracture, dislocation, or joint effusion. There is no evidence of arthropathy or other focal bone abnormality. Soft tissues are unremarkable. IMPRESSION: Negative. Electronically Signed   By: Luke Bun M.D.   On: 05/15/2024 19:38     Procedures   Medications Ordered in the ED  oxyCODONE -acetaminophen  (PERCOCET/ROXICET) 5-325 MG per tablet 1 tablet (1 tablet Oral  Given 05/15/24 1851)                                    Medical Decision Making Amount and/or Complexity of Data Reviewed Radiology: ordered.  Risk Prescription drug management.   Trevor Vargas is here with right arm pain.  Normal vitals.  No fever.  Pain while trying to work with a window.  I think Trevor Vargas does have either a forearm strain or maybe distal bicep tendon injury or some other soft tissue injury around the right elbow.  Got pain with supination at the elbow pain with extension of the right wrist.  Do not see any obvious deformity.  Trevor Vargas is neurovascular neuromuscular intact otherwise.  Will get an x-ray of the right elbow will give him a dose of Percocet.  Having any chest wall tenderness and have low suspicion for pectoral tear.  X-ray per my review and interpretation with no acute finding.  Radiology report with the same.  I do suspect likely a strain of Trevor Vargas right forearm will have him follow-up with orthopedics.  Placed in a sling for comfort.  Continue Tylenol  and ibuprofen and ice at home.  Discharge.  This chart was dictated using voice recognition software.  Despite best efforts to proofread,  errors can occur which can change the documentation meaning.      Final diagnoses:  Strain of right forearm, initial encounter    ED Discharge Orders     None          Ruthe Cornet, DO 05/15/24 1942

## 2024-05-15 NOTE — Discharge Instructions (Addendum)
 Overall I suspect he might have a forearm strain or some other soft tissue injury in that elbow area.  I recommend you follow-up with orthopedics.  Recommend 1000 mg of Tylenol  every 6 hours as needed for pain.  Recommend 400 mg ibuprofen every 8 hours as needed for pain.  Use sling for comfort.  Recommend ice as well.
